# Patient Record
Sex: Male | Born: 1980 | Race: White | Hispanic: Yes | Marital: Married | Smoking: Current some day smoker
Health system: Southern US, Community
[De-identification: ages and names within clinical notes are randomized; demographics above are authoritative.]

## PROBLEM LIST (undated history)

## (undated) ENCOUNTER — Emergency Department: Discharge status: Absent without leave | Payer: No Typology Code available for payment source

---

## 2010-09-10 ENCOUNTER — Emergency Department (HOSPITAL_COMMUNITY): Payer: Self-pay

## 2010-09-10 ENCOUNTER — Encounter (HOSPITAL_COMMUNITY): Payer: Self-pay | Admitting: Radiology

## 2010-09-10 ENCOUNTER — Emergency Department (HOSPITAL_COMMUNITY)
Admission: EM | Admit: 2010-09-10 | Discharge: 2010-09-11 | Disposition: A | Discharge status: Home / Self Care | Payer: No Typology Code available for payment source | Attending: Emergency Medicine | Admitting: Emergency Medicine

## 2010-09-10 DIAGNOSIS — S025XXA Fracture of tooth (traumatic), initial encounter for closed fracture: Secondary | ICD-10-CM | POA: Insufficient documentation

## 2010-09-10 DIAGNOSIS — K089 Disorder of teeth and supporting structures, unspecified: Secondary | ICD-10-CM | POA: Insufficient documentation

## 2010-09-10 DIAGNOSIS — IMO0002 Reserved for concepts with insufficient information to code with codable children: Secondary | ICD-10-CM | POA: Insufficient documentation

## 2010-09-10 DIAGNOSIS — S0003XA Contusion of scalp, initial encounter: Secondary | ICD-10-CM | POA: Insufficient documentation

## 2010-09-10 DIAGNOSIS — F1010 Alcohol abuse, uncomplicated: Secondary | ICD-10-CM | POA: Insufficient documentation

## 2010-09-10 LAB — POCT I-STAT, CHEM 8
BUN: 9 mg/dL (ref 6–23)
Calcium, Ion: 1.13 mmol/L (ref 1.12–1.32)
Calcium, Ion: 1.14 mmol/L (ref 1.12–1.32)
Chloride: 109 meq/L (ref 96–112)
Creatinine, Ser: 0.9 mg/dL (ref 0.50–1.35)
Creatinine, Ser: 1 mg/dL (ref 0.50–1.35)
Glucose, Bld: 101 mg/dL — ABNORMAL HIGH (ref 70–99)
Glucose, Bld: 117 mg/dL — ABNORMAL HIGH (ref 70–99)
HCT: 43 % (ref 39.0–52.0)
Hemoglobin: 12.9 g/dL — ABNORMAL LOW (ref 13.0–17.0)
Hemoglobin: 14.6 g/dL (ref 13.0–17.0)
Potassium: 3.6 mEq/L (ref 3.5–5.1)
Potassium: 3.6 meq/L (ref 3.5–5.1)
Sodium: 144 meq/L (ref 135–145)
TCO2: 23 mmol/L (ref 0–100)

## 2010-09-10 LAB — COMPREHENSIVE METABOLIC PANEL WITH GFR
ALT: 21 U/L (ref 0–53)
AST: 25 U/L (ref 0–37)
Albumin: 4.2 g/dL (ref 3.5–5.2)
Alkaline Phosphatase: 78 U/L (ref 39–117)
BUN: 9 mg/dL (ref 6–23)
CO2: 20 meq/L (ref 19–32)
Calcium: 9.3 mg/dL (ref 8.4–10.5)
Chloride: 107 meq/L (ref 96–112)
Creatinine, Ser: 0.6 mg/dL (ref 0.50–1.35)
Glucose, Bld: 116 mg/dL — ABNORMAL HIGH (ref 70–99)
Potassium: 3.6 meq/L (ref 3.5–5.1)
Sodium: 141 meq/L (ref 135–145)
Total Bilirubin: 0.1 mg/dL — ABNORMAL LOW (ref 0.3–1.2)
Total Protein: 8 g/dL (ref 6.0–8.3)

## 2010-09-10 LAB — TYPE AND SCREEN
ABO/RH(D): O POS
Antibody Screen: NEGATIVE

## 2010-09-10 LAB — ABO/RH: ABO/RH(D): O POS

## 2010-09-10 LAB — ETHANOL: Alcohol, Ethyl (B): 258 mg/dL — ABNORMAL HIGH (ref 0–11)

## 2010-09-10 LAB — CBC
HCT: 39.3 % (ref 39.0–52.0)
Hemoglobin: 13.8 g/dL (ref 13.0–17.0)
MCH: 29.5 pg (ref 26.0–34.0)
MCHC: 35.1 g/dL (ref 30.0–36.0)
MCV: 84 fL (ref 78.0–100.0)
Platelets: 316 10*3/uL (ref 150–400)
RBC: 4.68 MIL/uL (ref 4.22–5.81)
RDW: 12.7 % (ref 11.5–15.5)
WBC: 7.3 10*3/uL (ref 4.0–10.5)

## 2010-09-10 LAB — PROTIME-INR: Prothrombin Time: 13 seconds (ref 11.6–15.2)

## 2010-09-14 LAB — DRUG SCREEN PANEL (SERUM)

## 2017-01-25 ENCOUNTER — Emergency Department (HOSPITAL_COMMUNITY): Payer: No Typology Code available for payment source

## 2017-01-25 ENCOUNTER — Inpatient Hospital Stay (HOSPITAL_COMMUNITY)
Admission: EM | Admit: 2017-01-25 | Discharge: 2017-01-28 | DRG: 183 | Disposition: A | Discharge status: Home Health | Payer: No Typology Code available for payment source | Attending: Surgery | Admitting: Surgery

## 2017-01-25 ENCOUNTER — Other Ambulatory Visit: Payer: Self-pay

## 2017-01-25 DIAGNOSIS — S42002A Fracture of unspecified part of left clavicle, initial encounter for closed fracture: Secondary | ICD-10-CM

## 2017-01-25 DIAGNOSIS — S0990XA Unspecified injury of head, initial encounter: Secondary | ICD-10-CM

## 2017-01-25 DIAGNOSIS — S91012A Laceration without foreign body, left ankle, initial encounter: Secondary | ICD-10-CM | POA: Diagnosis present

## 2017-01-25 DIAGNOSIS — F10129 Alcohol abuse with intoxication, unspecified: Secondary | ICD-10-CM | POA: Diagnosis present

## 2017-01-25 DIAGNOSIS — S42022A Displaced fracture of shaft of left clavicle, initial encounter for closed fracture: Secondary | ICD-10-CM | POA: Diagnosis present

## 2017-01-25 DIAGNOSIS — S92902B Unspecified fracture of left foot, initial encounter for open fracture: Secondary | ICD-10-CM

## 2017-01-25 DIAGNOSIS — S92342B Displaced fracture of fourth metatarsal bone, left foot, initial encounter for open fracture: Secondary | ICD-10-CM | POA: Diagnosis present

## 2017-01-25 DIAGNOSIS — Y92410 Unspecified street and highway as the place of occurrence of the external cause: Secondary | ICD-10-CM

## 2017-01-25 DIAGNOSIS — F17200 Nicotine dependence, unspecified, uncomplicated: Secondary | ICD-10-CM | POA: Diagnosis present

## 2017-01-25 DIAGNOSIS — S2242XA Multiple fractures of ribs, left side, initial encounter for closed fracture: Secondary | ICD-10-CM | POA: Diagnosis not present

## 2017-01-25 DIAGNOSIS — S270XXA Traumatic pneumothorax, initial encounter: Secondary | ICD-10-CM | POA: Diagnosis present

## 2017-01-25 DIAGNOSIS — S92512A Displaced fracture of proximal phalanx of left lesser toe(s), initial encounter for closed fracture: Secondary | ICD-10-CM | POA: Diagnosis present

## 2017-01-25 DIAGNOSIS — S0003XA Contusion of scalp, initial encounter: Secondary | ICD-10-CM | POA: Diagnosis present

## 2017-01-25 DIAGNOSIS — Z23 Encounter for immunization: Secondary | ICD-10-CM

## 2017-01-25 DIAGNOSIS — J939 Pneumothorax, unspecified: Secondary | ICD-10-CM

## 2017-01-25 DIAGNOSIS — R0789 Other chest pain: Secondary | ICD-10-CM | POA: Diagnosis not present

## 2017-01-25 DIAGNOSIS — S92212B Displaced fracture of cuboid bone of left foot, initial encounter for open fracture: Secondary | ICD-10-CM | POA: Diagnosis present

## 2017-01-25 DIAGNOSIS — T161XXA Foreign body in right ear, initial encounter: Secondary | ICD-10-CM | POA: Diagnosis present

## 2017-01-25 DIAGNOSIS — S27309A Unspecified injury of lung, unspecified, initial encounter: Secondary | ICD-10-CM

## 2017-01-25 DIAGNOSIS — S27331A Laceration of lung, unilateral, initial encounter: Secondary | ICD-10-CM | POA: Diagnosis present

## 2017-01-25 LAB — COMPREHENSIVE METABOLIC PANEL
ALBUMIN: 4 g/dL (ref 3.5–5.0)
ALK PHOS: 91 U/L (ref 38–126)
ALT: 44 U/L (ref 17–63)
AST: 55 U/L — ABNORMAL HIGH (ref 15–41)
Anion gap: 11 (ref 5–15)
BILIRUBIN TOTAL: 0.5 mg/dL (ref 0.3–1.2)
BUN: 7 mg/dL (ref 6–20)
CALCIUM: 8.4 mg/dL — AB (ref 8.9–10.3)
CO2: 23 mmol/L (ref 22–32)
CREATININE: 0.62 mg/dL (ref 0.61–1.24)
Chloride: 105 mmol/L (ref 101–111)
GFR calc Af Amer: >60 mL/min (ref >60)
GFR calc non Af Amer: 58 mL/min — ABNORMAL LOW (ref >60)
Glucose, Bld: 119 mg/dL — ABNORMAL HIGH (ref 65–99)
Potassium: 3.5 mmol/L (ref 3.5–5.1)
Sodium: 139 mmol/L (ref 135–145)
TOTAL PROTEIN: 7.2 g/dL (ref 6.5–8.1)

## 2017-01-25 LAB — I-STAT CHEM 8, ED
BUN: 6 mg/dL (ref 6–20)
CALCIUM ION: 1.1 mmol/L — AB (ref 1.15–1.40)
Chloride: 104 mmol/L (ref 101–111)
Creatinine, Ser: 0.8 mg/dL (ref 0.61–1.24)
GLUCOSE: 120 mg/dL — AB (ref 65–99)
HCT: 39 % (ref 39.0–52.0)
HEMOGLOBIN: 13.3 g/dL (ref 13.0–17.0)
Potassium: 3.4 mmol/L — ABNORMAL LOW (ref 3.5–5.1)
Sodium: 143 mmol/L (ref 135–145)
TCO2: 25 mmol/L (ref 22–32)

## 2017-01-25 LAB — CBC
HEMATOCRIT: 37 % — AB (ref 39.0–52.0)
HEMOGLOBIN: 12.3 g/dL — AB (ref 13.0–17.0)
MCH: 28.6 pg (ref 26.0–34.0)
MCHC: 33.2 g/dL (ref 30.0–36.0)
MCV: 86 fL (ref 78.0–100.0)
Platelets: 367 10*3/uL (ref 150–400)
RBC: 4.3 MIL/uL (ref 4.22–5.81)
RDW: 13.3 % (ref 11.5–15.5)
WBC: 15.7 10*3/uL — ABNORMAL HIGH (ref 4.0–10.5)

## 2017-01-25 LAB — ETHANOL: ALCOHOL ETHYL (B): 144 mg/dL — AB (ref <10)

## 2017-01-25 LAB — TROPONIN I: Troponin I: <0.03 ng/mL (ref <0.03)

## 2017-01-25 LAB — I-STAT CG4 LACTIC ACID, ED: Lactic Acid, Venous: 2.57 mmol/L (ref 0.5–1.9)

## 2017-01-25 MED ORDER — IOPAMIDOL (ISOVUE-300) INJECTION 61%
INTRAVENOUS | Status: AC
  Administered 2017-01-25: 100 mL
  Filled 2017-01-25: qty 100

## 2017-01-25 MED ORDER — TETANUS-DIPHTH-ACELL PERTUSSIS 5-2.5-18.5 LF-MCG/0.5 IM SUSP
0.5000 mL | Freq: Once | INTRAMUSCULAR | Status: AC
  Administered 2017-01-25: 0.5 mL via INTRAMUSCULAR
  Filled 2017-01-25: qty 0.5

## 2017-01-25 MED ORDER — CEFAZOLIN SODIUM-DEXTROSE 1-4 GM/50ML-% IV SOLN
1.0000 g | Freq: Once | INTRAVENOUS | Status: AC
  Administered 2017-01-25: 1 g via INTRAVENOUS
  Filled 2017-01-25: qty 50

## 2017-01-25 MED ORDER — FENTANYL CITRATE (PF) 100 MCG/2ML IJ SOLN
50.0000 ug | Freq: Once | INTRAMUSCULAR | Status: AC
  Administered 2017-01-25: 50 ug via INTRAVENOUS
  Filled 2017-01-25: qty 2
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
## 2017-01-25 NOTE — ED Triage Notes (Signed)
Pt c/o of pain to left shoulder and chest

## 2017-01-25 NOTE — ED Notes (Signed)
Patient transported to CT 
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
## 2017-01-26 ENCOUNTER — Encounter (HOSPITAL_COMMUNITY): Payer: Self-pay | Admitting: *Deleted

## 2017-01-26 ENCOUNTER — Other Ambulatory Visit: Payer: Self-pay

## 2017-01-26 ENCOUNTER — Emergency Department (HOSPITAL_COMMUNITY): Payer: No Typology Code available for payment source

## 2017-01-26 ENCOUNTER — Inpatient Hospital Stay (HOSPITAL_COMMUNITY): Payer: No Typology Code available for payment source

## 2017-01-26 DIAGNOSIS — T161XXA Foreign body in right ear, initial encounter: Secondary | ICD-10-CM | POA: Diagnosis present

## 2017-01-26 DIAGNOSIS — Y92410 Unspecified street and highway as the place of occurrence of the external cause: Secondary | ICD-10-CM | POA: Diagnosis not present

## 2017-01-26 DIAGNOSIS — S91012A Laceration without foreign body, left ankle, initial encounter: Secondary | ICD-10-CM | POA: Diagnosis present

## 2017-01-26 DIAGNOSIS — S27331A Laceration of lung, unilateral, initial encounter: Secondary | ICD-10-CM | POA: Diagnosis present

## 2017-01-26 DIAGNOSIS — S2242XA Multiple fractures of ribs, left side, initial encounter for closed fracture: Secondary | ICD-10-CM | POA: Diagnosis present

## 2017-01-26 DIAGNOSIS — S92212B Displaced fracture of cuboid bone of left foot, initial encounter for open fracture: Secondary | ICD-10-CM | POA: Diagnosis present

## 2017-01-26 DIAGNOSIS — S0003XA Contusion of scalp, initial encounter: Secondary | ICD-10-CM | POA: Diagnosis present

## 2017-01-26 DIAGNOSIS — S270XXA Traumatic pneumothorax, initial encounter: Secondary | ICD-10-CM | POA: Diagnosis present

## 2017-01-26 DIAGNOSIS — F17200 Nicotine dependence, unspecified, uncomplicated: Secondary | ICD-10-CM | POA: Diagnosis present

## 2017-01-26 DIAGNOSIS — S92512A Displaced fracture of proximal phalanx of left lesser toe(s), initial encounter for closed fracture: Secondary | ICD-10-CM | POA: Diagnosis present

## 2017-01-26 DIAGNOSIS — S92342B Displaced fracture of fourth metatarsal bone, left foot, initial encounter for open fracture: Secondary | ICD-10-CM | POA: Diagnosis present

## 2017-01-26 DIAGNOSIS — R0789 Other chest pain: Secondary | ICD-10-CM | POA: Diagnosis present

## 2017-01-26 DIAGNOSIS — F10129 Alcohol abuse with intoxication, unspecified: Secondary | ICD-10-CM | POA: Diagnosis present

## 2017-01-26 DIAGNOSIS — S42022A Displaced fracture of shaft of left clavicle, initial encounter for closed fracture: Secondary | ICD-10-CM | POA: Diagnosis present

## 2017-01-26 DIAGNOSIS — Z23 Encounter for immunization: Secondary | ICD-10-CM | POA: Diagnosis not present

## 2017-01-26 LAB — BASIC METABOLIC PANEL
Anion gap: 10 (ref 5–15)
BUN: 7 mg/dL (ref 6–20)
CHLORIDE: 102 mmol/L (ref 101–111)
CO2: 23 mmol/L (ref 22–32)
CREATININE: 0.6 mg/dL — AB (ref 0.61–1.24)
Calcium: 9 mg/dL (ref 8.9–10.3)
GFR calc Af Amer: >60 mL/min (ref >60)
GFR calc non Af Amer: >60 mL/min (ref >60)
GLUCOSE: 125 mg/dL — AB (ref 65–99)
POTASSIUM: 4 mmol/L (ref 3.5–5.1)
Sodium: 135 mmol/L (ref 135–145)

## 2017-01-26 LAB — SAMPLE TO BLOOD BANK

## 2017-01-26 LAB — URINALYSIS, ROUTINE W REFLEX MICROSCOPIC
Bilirubin Urine: NEGATIVE
Glucose, UA: NEGATIVE mg/dL
HGB URINE DIPSTICK: NEGATIVE
KETONES UR: NEGATIVE mg/dL
Leukocytes, UA: NEGATIVE
Nitrite: NEGATIVE
PROTEIN: NEGATIVE mg/dL
Specific Gravity, Urine: 1.025 (ref 1.005–1.030)
pH: 6 (ref 5.0–8.0)

## 2017-01-26 LAB — CBC
HCT: 37.4 % — ABNORMAL LOW (ref 39.0–52.0)
HEMOGLOBIN: 12.6 g/dL — AB (ref 13.0–17.0)
MCH: 29 pg (ref 26.0–34.0)
MCHC: 33.7 g/dL (ref 30.0–36.0)
MCV: 86 fL (ref 78.0–100.0)
Platelets: 359 10*3/uL (ref 150–400)
RBC: 4.35 MIL/uL (ref 4.22–5.81)
RDW: 13.2 % (ref 11.5–15.5)
WBC: 13.6 10*3/uL — ABNORMAL HIGH (ref 4.0–10.5)

## 2017-01-26 LAB — PROTIME-INR
INR: 0.94
Prothrombin Time: 12.5 seconds (ref 11.4–15.2)

## 2017-01-26 LAB — I-STAT CG4 LACTIC ACID, ED: LACTIC ACID, VENOUS: 2.07 mmol/L — AB (ref 0.5–1.9)

## 2017-01-26 MED ORDER — ENOXAPARIN SODIUM 40 MG/0.4ML ~~LOC~~ SOLN
40.0000 mg | Freq: Every day | SUBCUTANEOUS | Status: DC
  Administered 2017-01-26 – 2017-01-28 (×3): 40 mg via SUBCUTANEOUS
  Filled 2017-01-26 (×4): qty 0.4

## 2017-01-26 MED ORDER — ACETAMINOPHEN 325 MG PO TABS
650.0000 mg | ORAL_TABLET | Freq: Four times a day (QID) | ORAL | Status: DC
  Administered 2017-01-26 – 2017-01-28 (×9): 650 mg via ORAL
  Filled 2017-01-26 (×9): qty 2

## 2017-01-26 MED ORDER — HYDRALAZINE HCL 20 MG/ML IJ SOLN: 10.0000 mg | INTRAMUSCULAR | Status: DC | PRN

## 2017-01-26 MED ORDER — LIDOCAINE-EPINEPHRINE (PF) 2 %-1:200000 IJ SOLN
20.0000 mL | Freq: Once | INTRAMUSCULAR | Status: AC
  Administered 2017-01-26: 20 mL
  Filled 2017-01-26: qty 20

## 2017-01-26 MED ORDER — ONDANSETRON 4 MG PO TBDP: 4.0000 mg | ORAL_TABLET | Freq: Four times a day (QID) | ORAL | Status: DC | PRN

## 2017-01-26 MED ORDER — ONDANSETRON HCL 4 MG/2ML IJ SOLN
4.0000 mg | Freq: Four times a day (QID) | INTRAMUSCULAR | Status: DC | PRN
  Administered 2017-01-26: 4 mg via INTRAVENOUS
  Filled 2017-01-26: qty 2

## 2017-01-26 MED ORDER — GABAPENTIN 600 MG PO TABS
300.0000 mg | ORAL_TABLET | Freq: Three times a day (TID) | ORAL | Status: DC
  Administered 2017-01-26 – 2017-01-28 (×7): 300 mg via ORAL
  Filled 2017-01-26 (×4): qty 1
  Filled 2017-01-26: qty 0.5
  Filled 2017-01-26 (×3): qty 1

## 2017-01-26 MED ORDER — INFLUENZA VAC SPLIT QUAD 0.5 ML IM SUSY
0.5000 mL | PREFILLED_SYRINGE | INTRAMUSCULAR | Status: AC
  Administered 2017-01-27: 0.5 mL via INTRAMUSCULAR
  Filled 2017-01-26: qty 0.5

## 2017-01-26 MED ORDER — HYDROMORPHONE HCL 1 MG/ML IJ SOLN
0.5000 mg | INTRAMUSCULAR | Status: DC | PRN
  Administered 2017-01-26 (×2): 0.5 mg via INTRAVENOUS
  Filled 2017-01-26 (×2): qty 1

## 2017-01-26 MED ORDER — FENTANYL CITRATE (PF) 100 MCG/2ML IJ SOLN
50.0000 ug | Freq: Once | INTRAMUSCULAR | Status: AC
  Administered 2017-01-26: 50 ug via INTRAVENOUS
  Filled 2017-01-26: qty 2

## 2017-01-26 MED ORDER — DOCUSATE SODIUM 100 MG PO CAPS
200.0000 mg | ORAL_CAPSULE | Freq: Two times a day (BID) | ORAL | Status: DC
  Administered 2017-01-26 – 2017-01-28 (×4): 200 mg via ORAL
  Filled 2017-01-26 (×4): qty 2

## 2017-01-26 MED ORDER — IBUPROFEN 600 MG PO TABS
600.0000 mg | ORAL_TABLET | Freq: Four times a day (QID) | ORAL | Status: DC
  Administered 2017-01-26 – 2017-01-28 (×9): 600 mg via ORAL
  Filled 2017-01-26 (×9): qty 1

## 2017-01-26 MED ORDER — OXYCODONE HCL 5 MG PO TABS: 5.0000 mg | ORAL_TABLET | Freq: Four times a day (QID) | ORAL | Status: DC | PRN

## 2017-01-26 MED ORDER — LACTATED RINGERS IV SOLN
INTRAVENOUS | Status: DC
  Administered 2017-01-26 (×2): 1 mL via INTRAVENOUS
  Administered 2017-01-26 – 2017-01-27 (×2): via INTRAVENOUS

## 2017-01-26 NOTE — ED Notes (Signed)
Patient NPO no Diet was ordered for Lunch.

## 2017-01-26 NOTE — ED Notes (Signed)
Aspen Collar noted to be intact on pt - upon this RN's arrival.

## 2017-01-26 NOTE — ED Notes (Signed)
CXR being performed at this time.

## 2017-01-26 NOTE — ED Notes (Signed)
Fiance at nurses' desk on phone w/pt's daughter.

## 2017-01-26 NOTE — ED Notes (Signed)
Paged Portable Equipment for SCD's. Pt's fiance has stepped out at the moment - stated will return. Pt remains lying on bed w/eyes closed - respirations even, unlabored.

## 2017-01-26 NOTE — ED Notes (Signed)
ED Provider at bedside. 

## 2017-01-26 NOTE — ED Notes (Signed)
Family at bedside. 

## 2017-01-26 NOTE — ED Notes (Signed)
PA Rob given a copy of lactic acid results 2.07

## 2017-01-26 NOTE — ED Notes (Signed)
Urine specimen cup placed at bedside. Fiance advised pt of need for urine specimen. Pt noted w/Aspen collar intact and left ankle elevated - splint - intact. Pt able to wiggle toes w/o difficulty.

## 2017-01-26 NOTE — Progress Notes (Signed)
Orthopedic Tech Progress Note Patient Details:  Glen BoopJose Brason White [name] Apr 01, 1980 [id number]  Ortho Devices Type of Ortho Device: Post (short leg) splint Ortho Device/Splint Location: lle Ortho Device/Splint Interventions: Ordered, Application   Post Interventions Patient Tolerated: Well Instructions Provided: Care of device, Adjustment of device   Glen White, Glen White 01/26/2017, 3:59 AM

## 2017-01-26 NOTE — ED Notes (Signed)
Pt and fiance aware pt has bed assignment.
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
## 2017-01-27 ENCOUNTER — Encounter (HOSPITAL_COMMUNITY): Payer: Self-pay | Admitting: *Deleted

## 2017-01-27 LAB — HIV ANTIBODY (ROUTINE TESTING W REFLEX): HIV Screen 4th Generation wRfx: NONREACTIVE

## 2017-01-27 MED ORDER — OXYCODONE HCL 5 MG PO TABS
5.0000 mg | ORAL_TABLET | Freq: Four times a day (QID) | ORAL | Status: DC | PRN
  Administered 2017-01-27 – 2017-01-28 (×2): 10 mg via ORAL
  Filled 2017-01-27 (×2): qty 2
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
## 2017-01-27 NOTE — Progress Notes (Signed)
Subjective:    Patient reports pain as 3 on 0-10 scale.No major issues this morning.     Objective: Vital signs in last 24 hours: Temp:  [97.8 F (36.6 C)-98.9 F (37.2 C)] 97.8 F (36.6 C) (01/20 0519) Pulse Rate:  [71-90] 71 (01/20 0519) Resp:  [15-18] 17 (01/20 0519) BP: (101-121)/(65-75) 101/65 (01/20 0519) SpO2:  [95 %-98 %] 97 % (01/20 0519) Weight:  [62.2 kg (137 lb 2 oz)] 62.2 kg (137 lb 2 oz) (01/19 1314)  Intake/Output from previous day: 01/19 0701 - 01/20 0700 In: 1375 [I.V.:1375] Out: 1400 [Urine:1400] Intake/Output this shift: Total I/O In: -  Out: 650 [Urine:650]  Recent Labs    01/25/17 2145 01/25/17 2158 01/26/17 0944  HGB 12.3* 13.3 12.6*   Recent Labs    01/25/17 2145 01/25/17 2158 01/26/17 0944  WBC 15.7*  --  13.6*  RBC 4.30  --  4.35  HCT 37.0* 39.0 37.4*  PLT 367  --  359   Recent Labs    01/25/17 2145 01/25/17 2158 01/26/17 0944  NA 139 143 135  K 3.5 3.4* 4.0  CL 105 104 102  CO2 23  --  23  BUN 7 6 7   CREATININE 0.62 0.80 0.60*  GLUCOSE 119* 120* 125*  CALCIUM 8.4*  --  9.0   Recent Labs    01/26/17 0108  INR 0.94    Sensation intact distally Dorsiflexion/Plantar flexion intact  Assessment/Plan:    Up with therapy.He may eat from Ortho standpoint.  Ranee GosselinRonald Dhrithi Riche 01/27/2017, 8:38 AM

## 2017-01-27 NOTE — Progress Notes (Signed)
Orthopedic Tech Progress Note Patient Details:  Iva BoopJose Jamelle [facility] 1980/08/30 [id number]  Ortho Devices Type of Ortho Device: Arm sling Ortho Device/Splint Location: lue Ortho Device/Splint Interventions: Application   Post Interventions Patient Tolerated: Well Instructions Provided: Care of device   Nikki DomCrawford, Kaysie Michelini 01/27/2017, 9:06 AM

## 2017-01-28 LAB — BASIC METABOLIC PANEL
ANION GAP: 10 (ref 5–15)
BUN: 6 mg/dL (ref 6–20)
CALCIUM: 8.9 mg/dL (ref 8.9–10.3)
CO2: 26 mmol/L (ref 22–32)
Chloride: 101 mmol/L (ref 101–111)
Creatinine, Ser: 0.7 mg/dL (ref 0.61–1.24)
GLUCOSE: 131 mg/dL — AB (ref 65–99)
POTASSIUM: 4.2 mmol/L (ref 3.5–5.1)
Sodium: 137 mmol/L (ref 135–145)

## 2017-01-28 LAB — CBC
HEMATOCRIT: 38.7 % — AB (ref 39.0–52.0)
Hemoglobin: 12.8 g/dL — ABNORMAL LOW (ref 13.0–17.0)
MCH: 29 pg (ref 26.0–34.0)
MCHC: 33.1 g/dL (ref 30.0–36.0)
MCV: 87.8 fL (ref 78.0–100.0)
PLATELETS: 334 10*3/uL (ref 150–400)
RBC: 4.41 MIL/uL (ref 4.22–5.81)
RDW: 13.5 % (ref 11.5–15.5)
WBC: 7.3 10*3/uL (ref 4.0–10.5)

## 2017-01-28 MED ORDER — OXYCODONE HCL 5 MG PO TABS: 5.0000 mg | ORAL_TABLET | Freq: Four times a day (QID) | ORAL | 0 refills | Status: AC | PRN
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
## 2017-01-28 NOTE — Progress Notes (Signed)
Discharge instruction reviewed with Glen White and his CG. Home health has been ordered, DME have been delivered to to the Pt's room. Pt being discharged to home this evening. Pt CG was upset about discharge home so late, however the Pt was alright with the discharge home tonight. Pt did not have questions.
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
## 2017-01-28 NOTE — Discharge Instructions (Signed)
1. PAIN CONTROL:  1. Pain is best controlled by a usual combination of three different methods TOGETHER:  1. Ice/Heat 2. Over the counter pain medication 3. Prescription pain medication 2. Most patients will experience some swelling and bruising around rib fractures. Ice packs or heating pads (30-60 minutes up to 6 times a day) will help. Use ice for the first few days to help decrease swelling and bruising, then switch to heat to help relax tight/sore spots and speed recovery. Some people prefer to use ice alone, heat alone, alternating between ice & heat. Experiment to what works for you. Swelling and bruising can take several weeks to resolve.  3. It is helpful to take an over-the-counter pain medication regularly for the first few weeks. Choose one of the following that works best for you:  1. Naproxen (Aleve, etc) Two 220mg  tabs twice a day 2. Ibuprofen (Advil, etc) Three 200mg  tabs four times a day (every meal & bedtime) 3. Acetaminophen (Tylenol, etc) 500-650mg  four times a day (every meal & bedtime) 4. A prescription for pain medication (such as oxycodone, hydrocodone, etc) should be given to you upon discharge. Take your pain medication as prescribed.  1. If you are having problems/concerns with the prescription medicine (does not control pain, nausea, vomiting, rash, itching, etc), please call us [phone] to see if we need to switch you to a different pain medicine that will work better for you and/or control your side effect better. 2. If you need a refill on your pain medication, please contact your pharmacy. They will contact our office to request authorization. Prescriptions will not be filled after 5 pm or on week-ends. 4. Avoid getting constipated. When taking pain medications, it is common to experience some constipation. Increasing fluid intake and taking a fiber supplement (such as Metamucil, Citrucel, FiberCon, MiraLax, etc) 1-2 times a day regularly will usually help prevent  this problem from occurring. A mild laxative (prune juice, Milk of Magnesia, MiraLax, etc) should be taken according to package directions if there are no bowel movements after 48 hours.  5. Watch out for diarrhea. If you have many loose bowel movements, simplify your diet to bland foods & liquids for a few days. Stop any stool softeners and decrease your fiber supplement. Switching to mild anti-diarrheal medications (Kayopectate, Pepto Bismol) can help. If this worsens or does not improve, please call us. 6. FOLLOW UP in our office  Please call [facility] at [phone] as needed with any questions or concerns   WHEN TO CALL us [phone]:  1. Poor pain control 2. Reactions / problems with new medications (rash/itching, nausea, etc)  3. Fever over 101.5 F (38.5 C) 4. Worsening swelling or bruising 5. Continued bleeding from wounds. 6. Increased pain, redness, or drainage from the wounds which could be signs of infection  The clinic staff is available to answer your questions during regular business hours (8:30am-5pm). Please dont hesitate to call and ask to speak to one of our nurses for clinical concerns.  If you have a medical emergency, go to the nearest emergency room or call 911.  A surgeon from [facility] is always on call at the [facility], [place]  [address], [address], [place], [place] [address] ?  MAIN: [phone] ? TOLL FREE: [phone] ?  FAX [phone]  [email or web address]   Kara Pacer de costilla (Rib Fracture) Una fractura de costilla es la ruptura de uno de los huesos que la  forman. Las costillas son un grupo de huesos largos y curvos que rodean el pecho y estn adheridos a la columna vertebral. Protegen a los pulmones y a otros rganos que se encuentran en la cavidad torcica. La fractura o fisura de una costilla puede ser dolorosa pero no causa otros problemas. La mayora de las fracturas de costillas se  curan por s mismas luego de un tiempo. Sin embargo, Producer, television/film/video a ser ms grave si se rompen varias costillas o si se desplazan y Education administrator. CAUSAS  Un golpe directo en el pecho. Por ejemplo, esto puede ocurrir [employer]erv prctica de deportes de [place], un accidente automovilstico o una cada sobre un [place] duro.  Los movimientos repetitivos con una gran fuerza, como al lanzar una pelota en el bisbol, o tener episodios de tos intensos. SNTOMAS  Dolor al respirar o al toser.  Dolor cuando alguien presiona la zona lesionada. DIAGNSTICO El [employer] har un examen fsico. Le indicar diferentes estudios por imgenes para confirmar el diagnstico y buscar lesiones relacionadas. Estos estudios incluyen radiografas de trax, tomografa computada (TC), resonancia magntica (MRI) o gammagrafa sea. TRATAMIENTO La mayora de las fracturas de costillas se curan por s mismas en 1 a 3 meses. Los perodos de curacin ms prolongados generalmente se asocian a tos continua o a otras actividades que agravan el problema. Durante el perodo de curacin es muy importante el control del dolor. Generalmente se recetan medicamentos para Human resources officer. Ser necesaria la hospitalizacin o una ciruga si hay lesiones ms graves, como las fracturas de mltiples costillas o aquellas en las que las costillas se desplazan. INSTRUCCIONES PARA EL CUIDADO EN EL HOGAR  Evite las actividades extenuantes y toda [place] o movimiento que le cause dolor. Realice las actividades con cuidado y evite golpearse las costillas lesionadas.  Reanude la actividad sexual cuando le indique su mdico.  Tome slo medicamentos de venta libre o recetados, segn las indicaciones del mdico. No tome otros medicamentos sin Science writer a su mdico.  Aplique hielo en la zona [employer] primeros 1 a 2 das despus de haber recibido tratamiento o segn las indicaciones de su mdico. La aplicacin del hielo  ayuda a reducir la inflamacin y Chief Technology Officer. ? Ponga el hielo en una bolsa plstica. ? Colquese una [employer] piel y la bolsa de hielo. ? Deje el hielo durante 15 a 20 minutos, cada 2 horas mientras se encuentre despierto.  Haga ejercicios de Arts development officer indique su mdico. Esto ayudar a evitar la neumona, que es una complicacin frecuente en las fracturas de [place]. El mdico le indicar que: ? Sports administrator respiraciones profundas varias veces al da. ? Trate de toser [employer] al da, sosteniendo el rea lesionada con [place]. ? Use un dispositivo llamado espirmetro incentivo para realizar respiraciones profundas varias veces por da.  Beba suficiente lquido para Photographer orina clara o de color amarillo plido. Esto le ayudar a Chief Strategy Officer.  No use cinturones ni sujetadores. Esta limitacin de la respiracin puede causar neumona. SOLICITE ATENCIN MDICA DE INMEDIATO SI:  Tiene fiebre.  Tiene dificultad para respirar o Company secretary.  Tiene tos continua o elimina moco espeso o sanguinolento.  Tiene Programme researcher, broadcasting/film/video (nuseas), devuelve (vomita), o tiene dolor abdominal.  El dolor aumenta y no se alivia con los medicamentos. ASEGRESE DE QUE:  Comprende estas instrucciones.  Controlar su enfermedad.  Recibir ayuda de inmediato si no mejora o si empeora. Esta informacin no  tiene Theme park manager el consejo del mdico. Asegrese de hacerle al mdico cualquier pregunta que tenga. Document Released: 10/04/2004 Document Revised: 08/27/2012 Document Reviewed: 02/27/2012 Elsevier Interactive Patient Education  2018 [employer].   Neumotrax (Pneumothorax) Un neumotrax, comnmente llamado pulmn colapsado, es una afeccin en la que se filtra aire de un pulmn y se acumula en el espacio entre el pulmn y la pared torcica (espacio pleural). El aire en un neumotrax est atrapado fuera del pulmn y ocupa espacio, y esto le impide al pulmn  expandirse por completo. Este problema suele aparecer rpidamente. La acumulacin de aire puede ser pequea o grande. Un neumotrax pequeo puede desaparecer solo. Cuando un neumotrax es ms grande suele necesitar tratamiento mdico y hospitalizacin. CAUSAS A veces, un neumotrax puede formarse rpidamente sin causa aparente. Las personas que tienen problemas de [employer], en particular EPOC o enfisema, tienen un riesgo mayor de tener un neumotrax. No obstante, un neumotrax puede formarse rpidamente incluso en personas sin problemas pulmonares conocidos. Los traumatismos, cirugas, procedimientos mdicos o lesiones en la pared torcica tambin pueden provocar un neumotrax. SIGNOS Y SNTOMAS En ocasiones, el neumotrax no tiene sntomas. Si se presentan sntomas, estos pueden ser:  Journalist, newspaper.  Falta de aire.  Frecuencia respiratoria aumentada.  Color azulado en los labios o la piel (cianosis). DIAGNSTICO Por lo general, el neumotrax se diagnostica mediante una radiografa o una tomografa computada de trax. El mdico le har una historia clnica y un examen fsico para determinar por qu puede tener un neumotrax. TRATAMIENTO Un neumotrax pequeo puede desaparecer solo sin tratamiento. A veces, oxgeno extra puede colaborar para que un neumotrax pequeo desparezca con mayor rapidez. En el caso de un neumotrax ms grande o que causa sntomas, suele ser necesario un procedimiento para Dealer. En algunos casos, el mdico puede drenar el aire utilizando [place]. En otros, se puede introducir un tubo pleural en el espacio pleural. Un tubo pleural es un tubo pequeo que se coloca entre las costillas en el espacio pleural. El tubo elimina el aire extra y le permite al pulmn volver a expandirse hasta su tamao normal. Un neumotrax grande, por lo general, necesita hospitalizacin. Si existe filtracin de aire constante en el espacio pleural, es posible que el tubo  pleural se deba dejar colocado durante varios das Teachers Insurance and Annuity Association. En algunos casos, podra necesitarse [place]. INSTRUCCIONES PARA EL CUIDADO EN EL HOGAR  Tome solo medicamentos de venta libre o recetados, segn las indicaciones del mdico.  Si el dolor o la tos le dificultan el sueo por la noche, trate de dormir en posicin semierguida en una reposera o [employer] o tres [place].  Limite las actividades segn las indicaciones del mdico.  Si le haban colocado un tubo pleural y este fue retirado, consulte con su mdico cundo es el mejor momento para retirar el vendaje. Hasta que el mdico le diga que puede retirar el vendaje, no permita que se moje.  No fume. Fumar es un factor de riesgo para el neumotrax.  No viaje en avin ni nade bajo el agua hasta que su mdico le permita hacerlo.  Concurra a las consultas de control con su mdico segn las indicaciones. SOLICITE ATENCIN MDICA DE INMEDIATO SI:  Siente falta de aire o dolor en el pecho cada vez ms intenso.  Tiene una tos que no se puede controlar con supresores.  Comienza a escupir sangre al toser.  Siente un dolor que va en aumento o  que no puede controlar con los United Parcelmedicamentos.  Elimina moco ms espeso, (esputo) de color amarillo o verde.  Tiene enrojecimiento, dolor cada vez ms intenso o Administrator, artsuna secrecin en el lugar donde se coloc el tubo pleural (en caso de que se haya tratado el neumotrax con un tubo pleural).  Se abre el lugar donde se coloc el tubo pleural.  Siente que sale aire del lugar donde se coloc el tubo pleural.  Tiene fiebre o sntomas persistentes durante ms de 2a 3das.  Tiene fiebre y los sntomas empeoran repentinamente. ASEGRESE DE QUE:  Comprende estas instrucciones.  Controlar su afeccin.  Recibir ayuda de inmediato si no mejora o si empeora. Esta informacin no tiene Theme park managercomo fin reemplazar el consejo del mdico. Asegrese de hacerle al mdico cualquier pregunta  que tenga. Document Released: 10/04/2004 Document Revised: 10/15/2012 Document Reviewed: 05/20/2013 Elsevier Interactive Patient Education  [employer]2018 Elsevier Inc.
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
## 2017-01-29 ENCOUNTER — Encounter (HOSPITAL_COMMUNITY): Payer: Self-pay | Admitting: Radiology

## 2017-02-19 ENCOUNTER — Ambulatory Visit: Payer: Self-pay | Attending: Orthopedic Surgery | Admitting: Physical Therapy

## 2018-01-03 ENCOUNTER — Emergency Department
Admission: EM | Admit: 2018-01-03 | Discharge: 2018-01-03 | Disposition: A | Discharge status: Home / Self Care | Payer: Self-pay | Attending: Emergency Medicine | Admitting: Emergency Medicine

## 2018-01-03 ENCOUNTER — Emergency Department: Payer: Self-pay

## 2018-01-03 ENCOUNTER — Other Ambulatory Visit: Payer: Self-pay

## 2018-01-03 ENCOUNTER — Encounter: Payer: Self-pay | Admitting: Emergency Medicine

## 2018-01-03 DIAGNOSIS — M542 Cervicalgia: Secondary | ICD-10-CM | POA: Insufficient documentation

## 2018-01-03 DIAGNOSIS — F17210 Nicotine dependence, cigarettes, uncomplicated: Secondary | ICD-10-CM | POA: Insufficient documentation

## 2018-01-03 DIAGNOSIS — Y998 Other external cause status: Secondary | ICD-10-CM | POA: Insufficient documentation

## 2018-01-03 DIAGNOSIS — W19XXXA Unspecified fall, initial encounter: Secondary | ICD-10-CM

## 2018-01-03 DIAGNOSIS — R51 Headache: Secondary | ICD-10-CM | POA: Insufficient documentation

## 2018-01-03 DIAGNOSIS — Y9289 Other specified places as the place of occurrence of the external cause: Secondary | ICD-10-CM | POA: Insufficient documentation

## 2018-01-03 DIAGNOSIS — Y9389 Activity, other specified: Secondary | ICD-10-CM | POA: Insufficient documentation

## 2018-01-03 DIAGNOSIS — W1789XA Other fall from one level to another, initial encounter: Secondary | ICD-10-CM | POA: Insufficient documentation

## 2018-01-03 MED ORDER — METHOCARBAMOL 500 MG PO TABS: 500.0000 mg | ORAL_TABLET | Freq: Three times a day (TID) | ORAL | 0 refills | Status: AC | PRN

## 2018-01-03 MED ORDER — MELOXICAM 15 MG PO TABS: 15.0000 mg | ORAL_TABLET | Freq: Every day | ORAL | 1 refills | Status: AC
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
## 2018-01-03 NOTE — ED Notes (Signed)

## 2018-01-03 NOTE — ED Triage Notes (Signed)
Pt to ED c/o left shoulder pain after fall at work last Friday.  Pt ambulatory to triage, chest rise even and unlabored, NAD at this time.

## 2019-10-10 ENCOUNTER — Encounter (HOSPITAL_COMMUNITY): Payer: Self-pay | Admitting: Emergency Medicine

## 2019-10-10 ENCOUNTER — Other Ambulatory Visit: Payer: Self-pay

## 2019-10-10 ENCOUNTER — Emergency Department (HOSPITAL_COMMUNITY)
Admission: EM | Admit: 2019-10-10 | Discharge: 2019-10-11 | Disposition: A | Discharge status: Home / Self Care | Payer: Self-pay | Attending: Emergency Medicine | Admitting: Emergency Medicine

## 2019-10-10 DIAGNOSIS — Z5321 Procedure and treatment not carried out due to patient leaving prior to being seen by health care provider: Secondary | ICD-10-CM | POA: Insufficient documentation

## 2019-10-10 DIAGNOSIS — R109 Unspecified abdominal pain: Secondary | ICD-10-CM | POA: Insufficient documentation

## 2019-10-10 DIAGNOSIS — M549 Dorsalgia, unspecified: Secondary | ICD-10-CM | POA: Insufficient documentation

## 2019-10-10 DIAGNOSIS — S0083XA Contusion of other part of head, initial encounter: Secondary | ICD-10-CM | POA: Insufficient documentation
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
## 2019-10-11 NOTE — ED Notes (Addendum)
Patient "does not want to wait longer," LWBS. NT removed IV.

## 2020-05-05 IMAGING — CR DG SHOULDER 2+V*L*
1 series · 3 of 3 positions shown · non-contrast
Comparison: None.

CLINICAL DATA: Pt states he fell off ladder from a couple feet in
air. Hit head and left shoulder

EXAM:
LEFT SHOULDER - 2+ VIEW

[Series 1: dg shoulder left · 0.14mm/px · 3 of 3 slices shown]
[im 1/3]
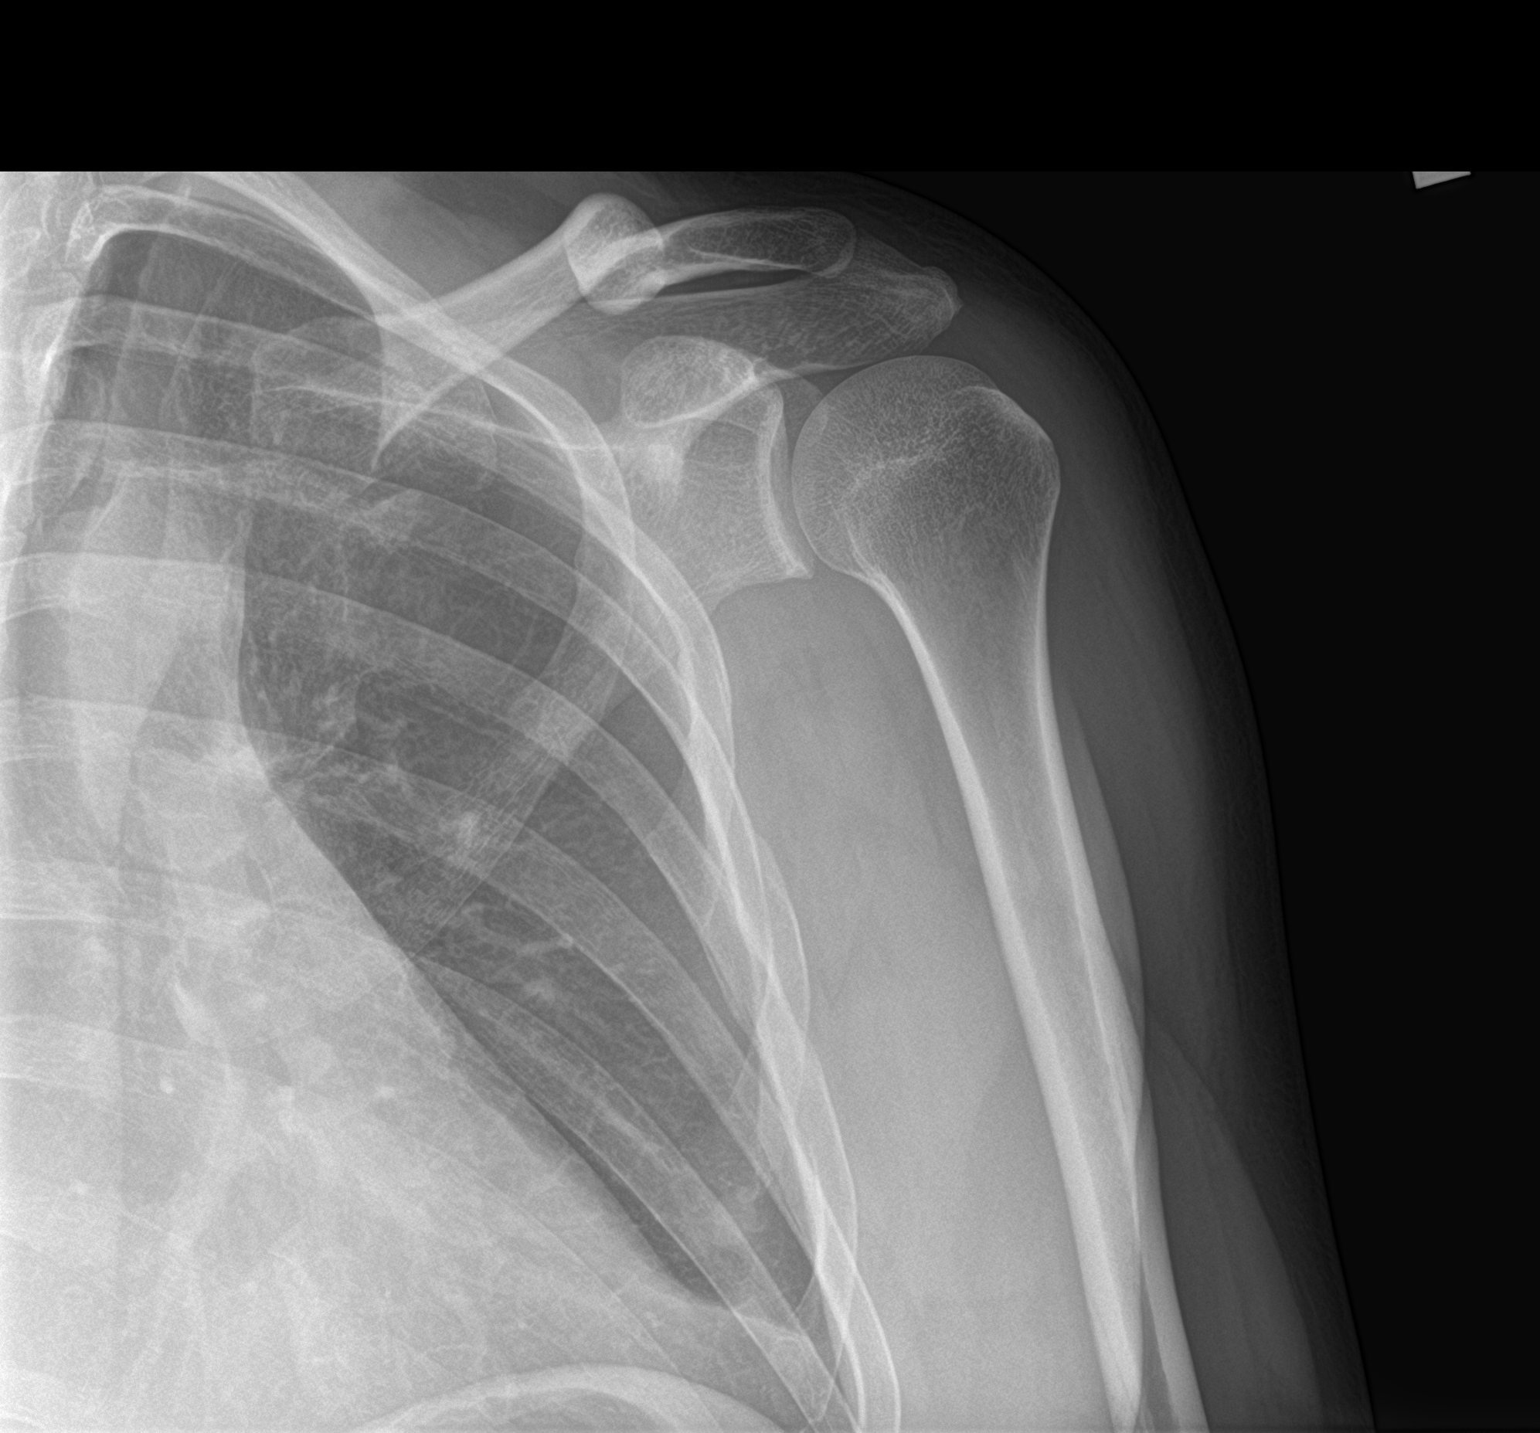
[im 2/3]
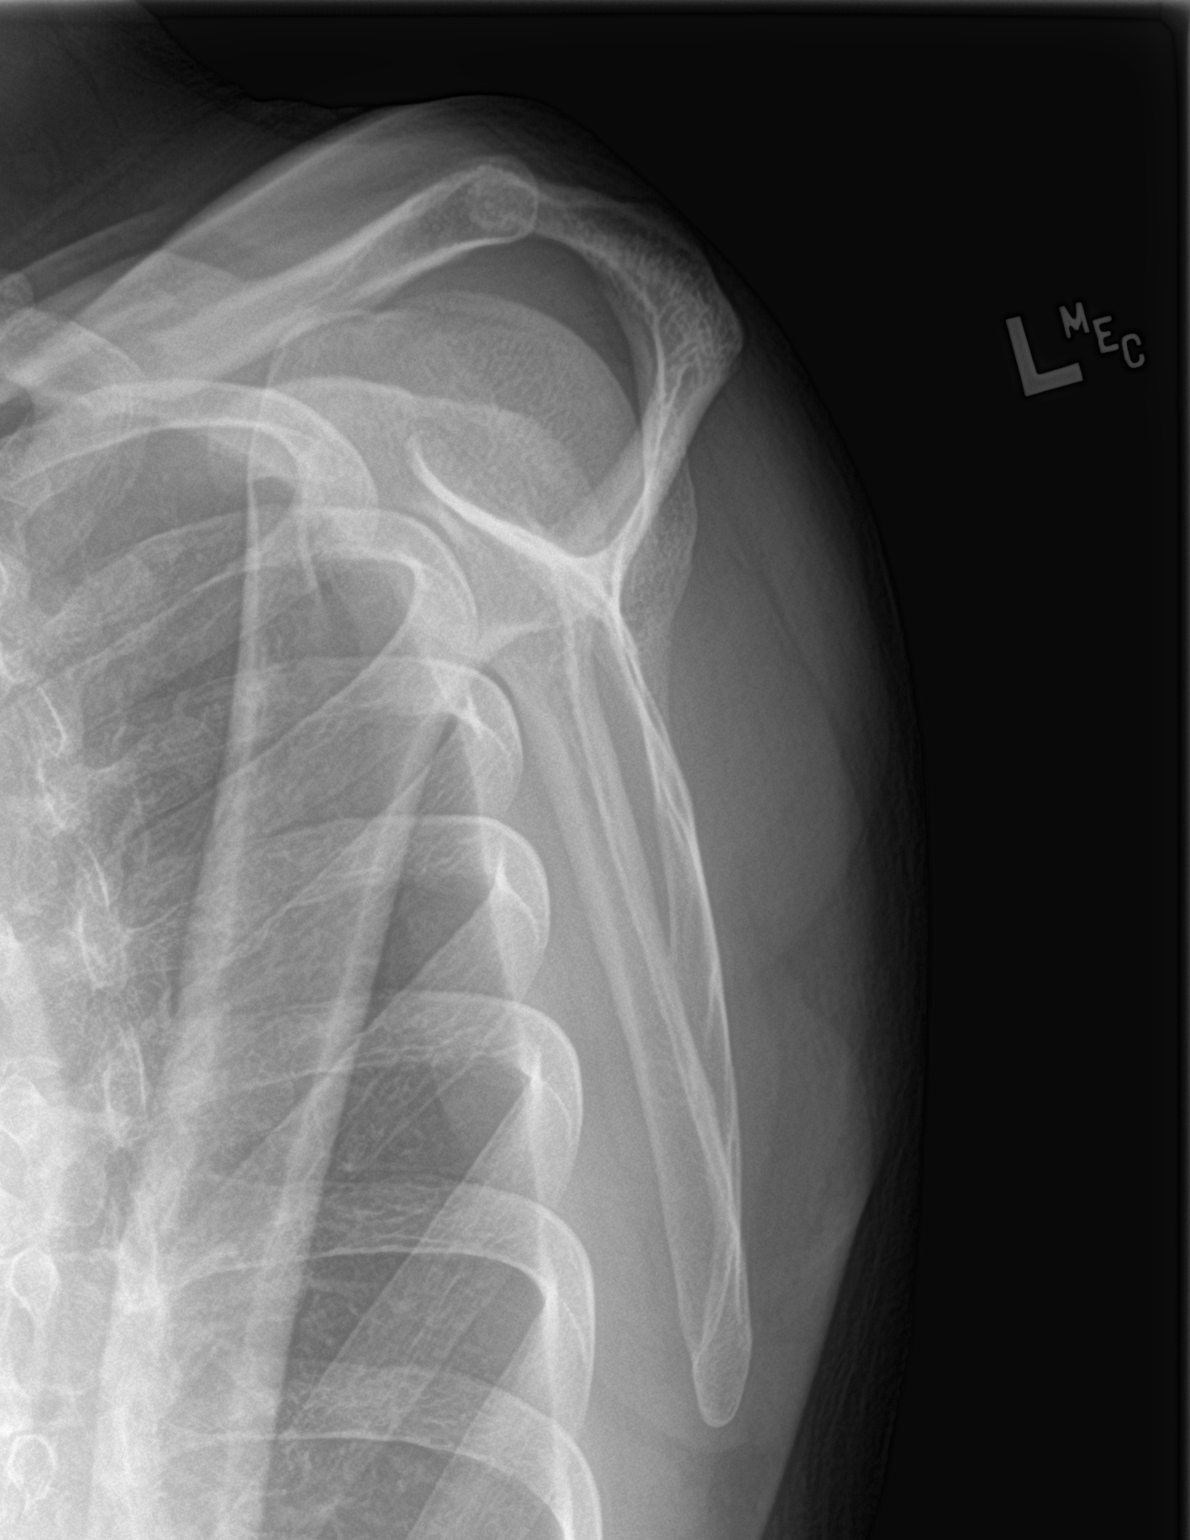
[im 3/3]
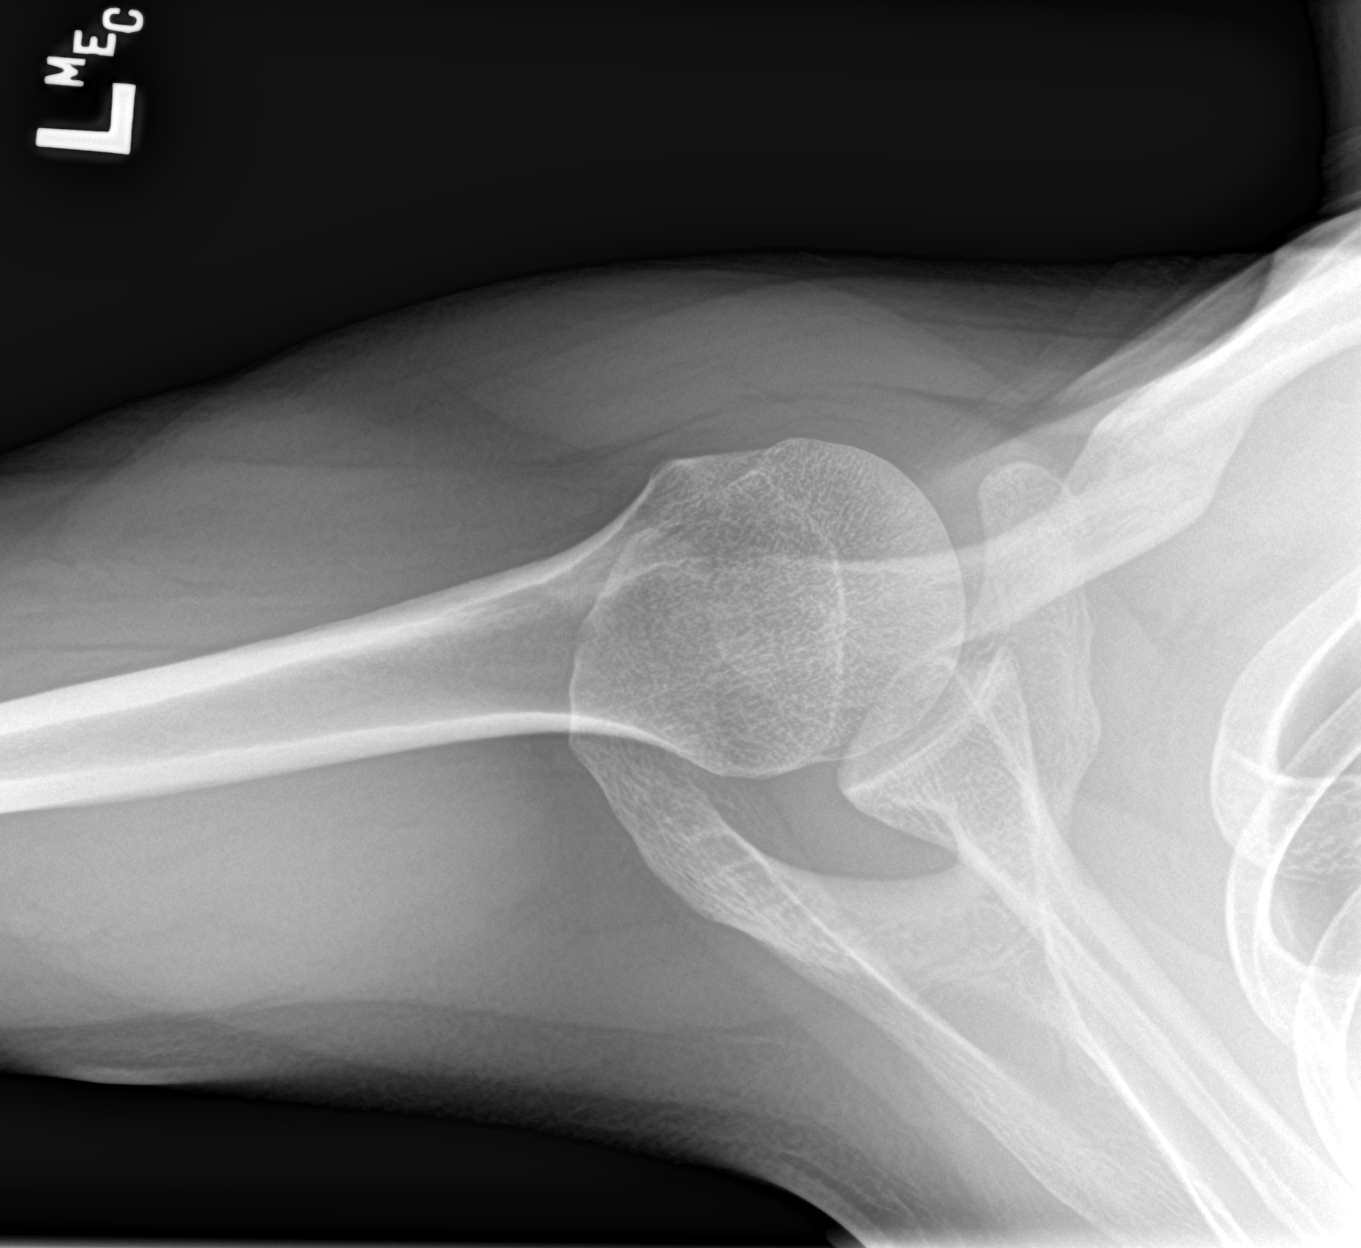

[3 of 3 positions shown; findings below may reference images not displayed]

FINDINGS: There is deformity of the RIGHT clavicle consistent with a remote
injury. No acute fracture of the scapula or proximal humerus.

There is deformity of numerous RIGHT ribs which may represent remote
fractures. Is difficult to entirely exclude acute rib fractures.
IMPRESSION: 1. Remote fracture of the LEFT clavicle.
2. No acute fracture of the shoulder.
3. Deformity of numerous LEFT-sided ribs are favored to be remote.
Consider rib detail views if needed.

## 2020-05-05 IMAGING — CT CT CERVICAL SPINE W/O CM
5 of 8 series · 12 of 33 positions shown, 13 images · non-contrast
Comparison: None.

CLINICAL DATA: Pt to ED c/o left shoulder pain after fall at work
[REDACTED]. States hit left side of head, LOC for 5 mins. Abrasions
to left ear. Also c/o left side and posterior neck pain.

EXAM:
CT HEAD WITHOUT CONTRAST
CT CERVICAL SPINE WITHOUT CONTRAST
TECHNIQUE: Multidetector CT imaging of the head and cervical spine was
performed following the standard protocol without intravenous
contrast. Multiplanar CT image reconstructions of the cervical spine
were also generated.

[Series 3: head bone · axial · 0.38mm/px · z∈[-57,-11]mm · 2 of 69 slices shown]
[im 23/69  bone]
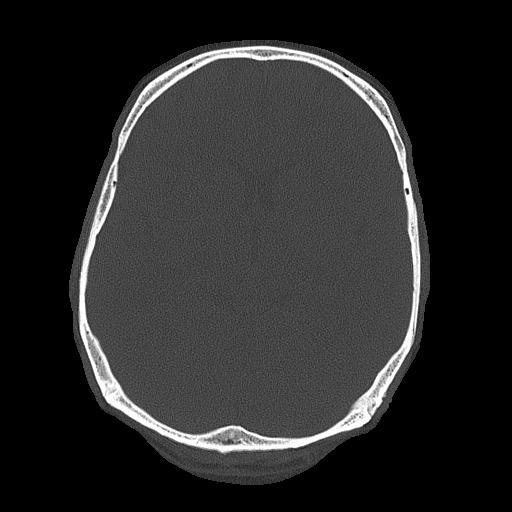
[im 46/69  bone]
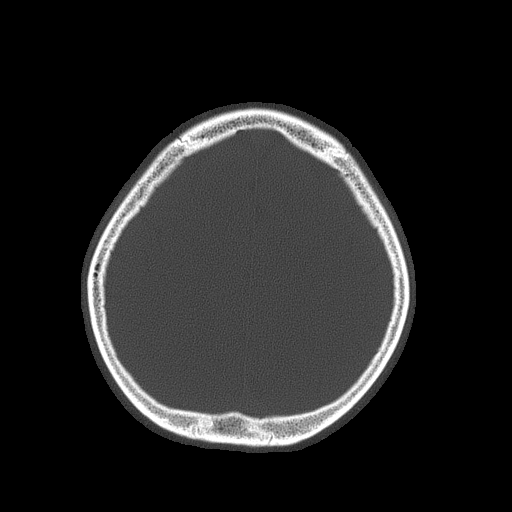

[Series 5: coronal soft tissue · coronal · 0.29mm/px · 2 of 56 slices shown]
[im 19/56  bone]
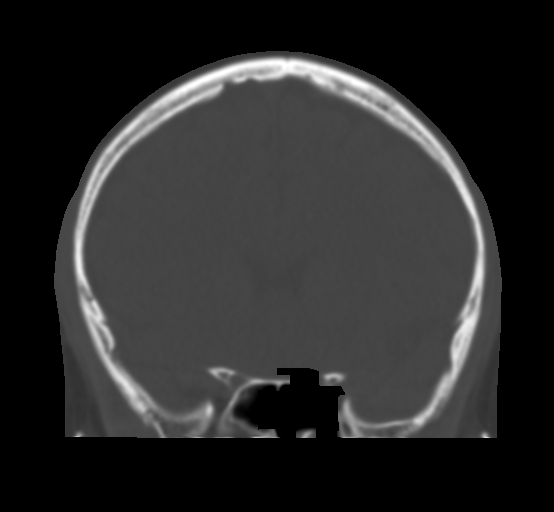
[im 37/56  bone]
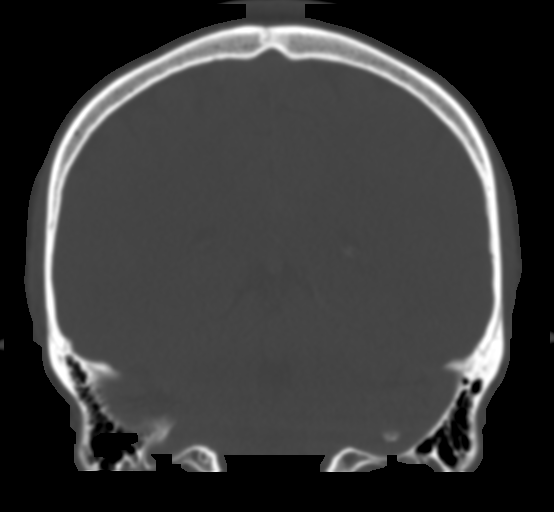

[Series 7: c spine soft · axial · 0.27mm/px · z∈[-192,-140]mm · 2 of 78 slices shown]
[im 26/78  soft-tissue]
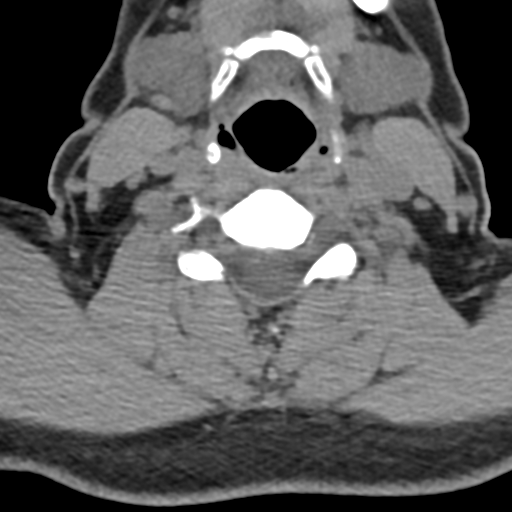
[im 52/78  soft-tissue]
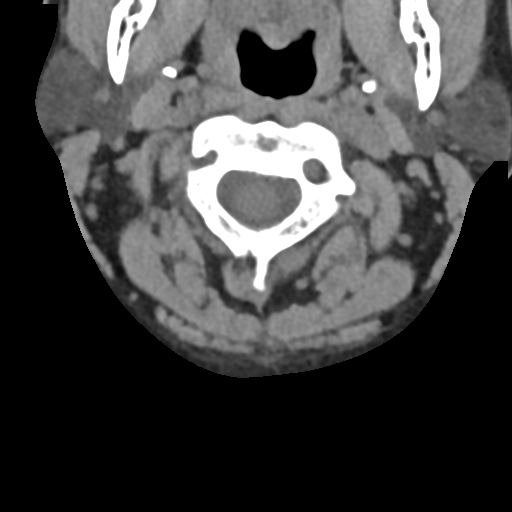

[Series 8: sagittal bone · sagittal · 0.22mm/px · 4 of 42 slices shown]
[im 9/42  bone]
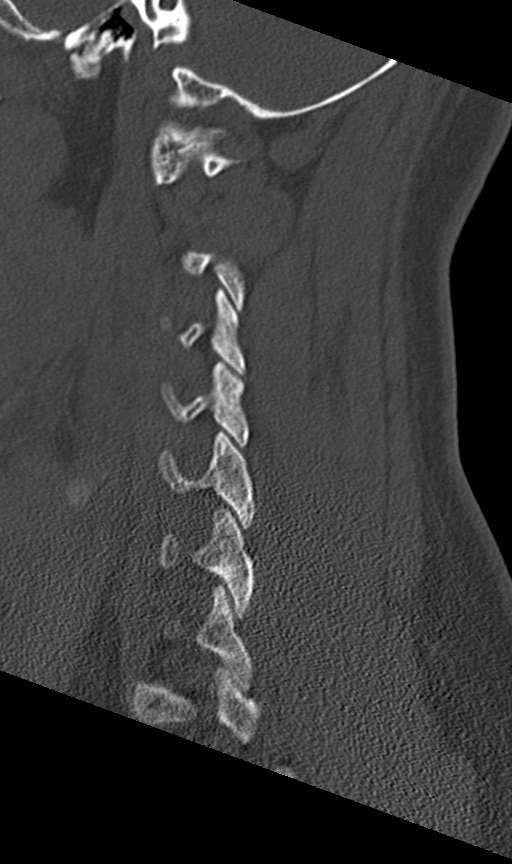
[im 17/42  bone]
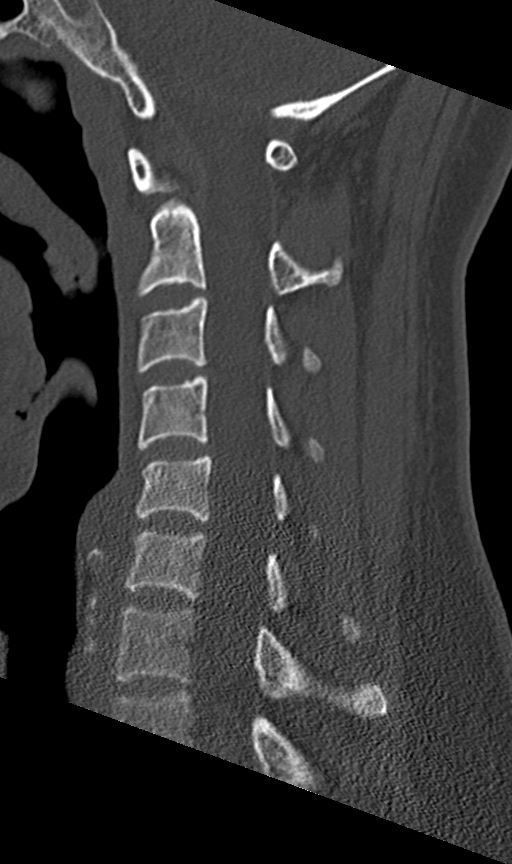
[im 25/42  bone]
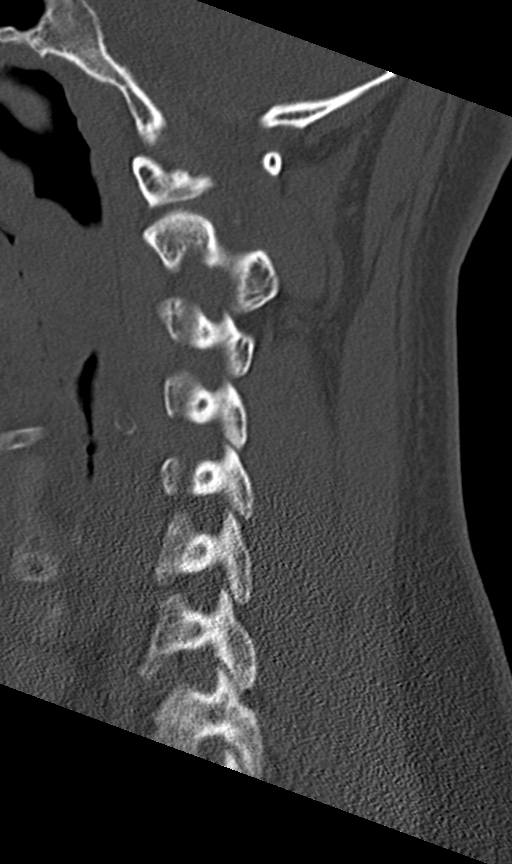
[im 33/42  bone]
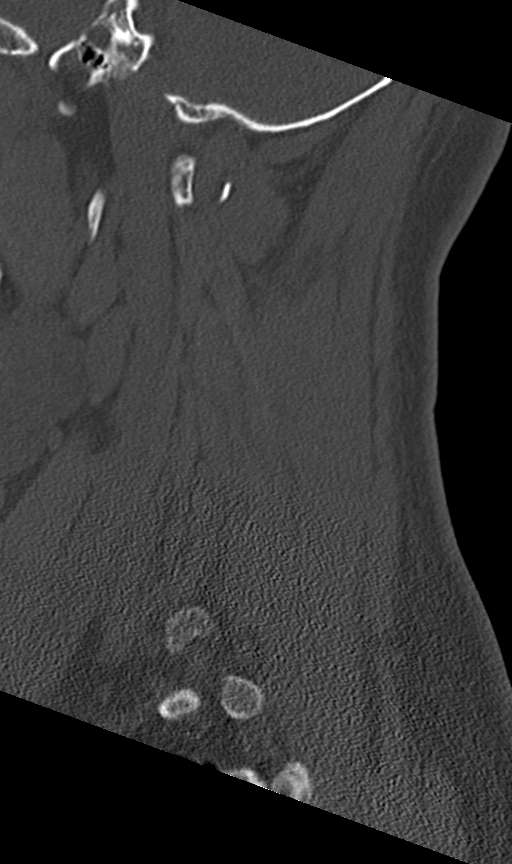

[Series 10: orthogonal bone · axial · 0.21mm/px · z∈[-208,-165]mm · 2 of 70 slices shown, 3 images]
[im 24/70  soft-tissue]
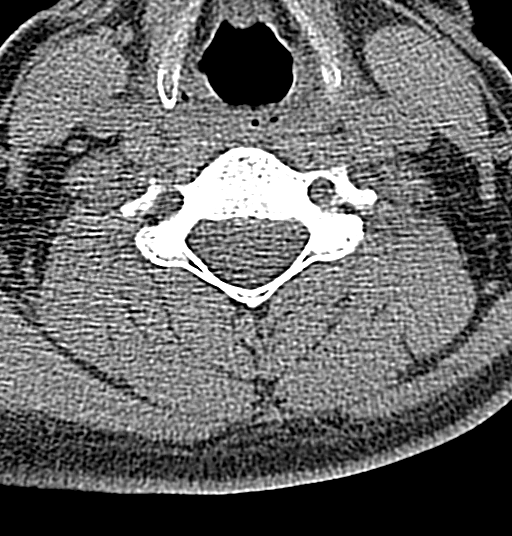
[im 24/70  bone]
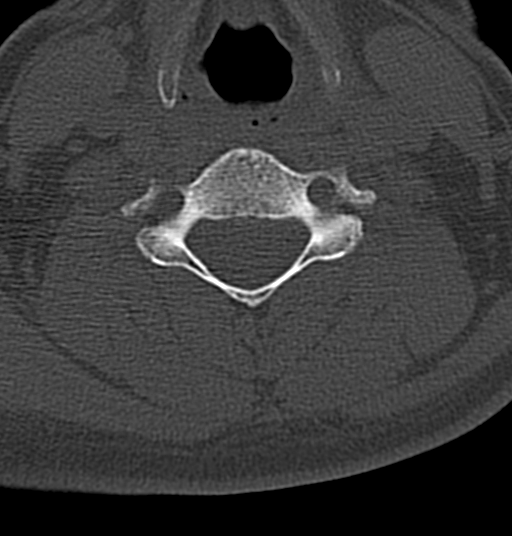
[im 47/70  bone]
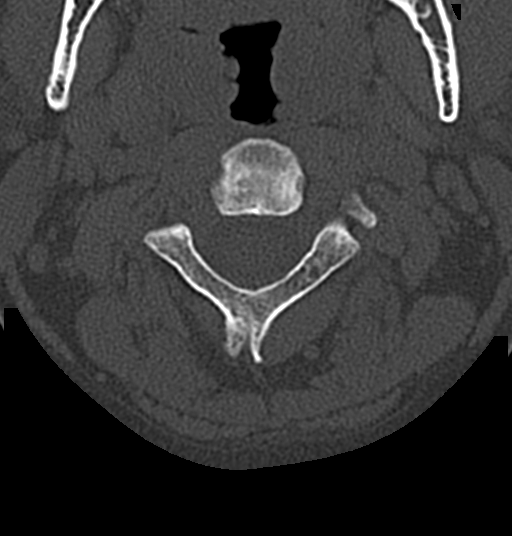

[12 of 33 positions shown; findings below may reference images not displayed]

FINDINGS: CT HEAD FINDINGS

Brain: No evidence of acute infarction, hemorrhage, hydrocephalus,
extra-axial collection or mass lesion/mass effect.

Vascular: No hyperdense vessel or unexpected calcification.

Skull: Normal. Negative for fracture or focal lesion.

Sinuses/Orbits: There is mucoperiosteal thickening of the paranasal
sinuses. No air-fluid levels.

Other: None

CT CERVICAL SPINE FINDINGS

Alignment: There is loss of cervical lordosis. This may be secondary
to splinting, soft tissue injury, or positioning. Otherwise,
alignment is normal.

Skull base and vertebrae: No acute fracture. No primary bone lesion
or focal pathologic process.

Soft tissues and spinal canal: No prevertebral fluid or swelling. No
visible canal hematoma.

Disc levels:  Unremarkable.

Upper chest: Negative.

Other: None
IMPRESSION: 1.  No evidence for acute intracranial abnormality.
2. Chronic sinus changes.
3. Loss of cervical lordosis.  No acute cervical spine fracture.
